# Patient Record
Sex: Male | Born: 1990 | Race: White | Hispanic: No | Marital: Single | Smoking: Former smoker
Health system: Southern US, Community
[De-identification: ages and names within clinical notes are randomized; demographics above are authoritative.]

## PROBLEM LIST (undated history)

## (undated) DIAGNOSIS — F32A Depression, unspecified: Secondary | ICD-10-CM

## (undated) DIAGNOSIS — F419 Anxiety disorder, unspecified: Secondary | ICD-10-CM

## (undated) DIAGNOSIS — F329 Major depressive disorder, single episode, unspecified: Secondary | ICD-10-CM

## (undated) HISTORY — PX: TESTICLE SURGERY: SHX794

## (undated) HISTORY — DX: Major depressive disorder, single episode, unspecified: F32.9

## (undated) HISTORY — DX: Anxiety disorder, unspecified: F41.9

## (undated) HISTORY — DX: Depression, unspecified: F32.A

---
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
## 2013-06-07 ENCOUNTER — Ambulatory Visit: Payer: Self-pay | Admitting: Family Medicine

## 2013-06-07 VITALS — BP 110/68 | HR 108 | Temp 98.0°F | Resp 16

## 2013-06-07 DIAGNOSIS — R1032 Left lower quadrant pain: Secondary | ICD-10-CM

## 2013-06-07 DIAGNOSIS — R509 Fever, unspecified: Secondary | ICD-10-CM

## 2013-06-07 DIAGNOSIS — R1033 Periumbilical pain: Secondary | ICD-10-CM

## 2013-06-07 DIAGNOSIS — J029 Acute pharyngitis, unspecified: Secondary | ICD-10-CM

## 2013-06-07 LAB — POCT CBC
GRANULOCYTE PERCENT: 92.1 % — AB (ref 37–80)
HCT, POC: 40.6 % — AB (ref 43.5–53.7)
Hemoglobin: 12.9 g/dL — AB (ref 14.1–18.1)
LYMPH, POC: 0.6 (ref 0.6–3.4)
MCH, POC: 29.1 pg (ref 27–31.2)
MCHC: 31.8 g/dL (ref 31.8–35.4)
MCV: 91.6 fL (ref 80–97)
MID (CBC): 0.3 (ref 0–0.9)
MPV: 8.1 fL (ref 0–99.8)
PLATELET COUNT, POC: 213 10*3/uL (ref 142–424)
POC GRANULOCYTE: 10.7 — AB (ref 2–6.9)
POC LYMPH %: 4.9 % — AB (ref 10–50)
POC MID %: 3 % (ref 0–12)
RBC: 4.43 M/uL — AB (ref 4.69–6.13)
RDW, POC: 15.6 %
WBC: 11.6 10*3/uL — AB (ref 4.6–10.2)

## 2013-06-07 LAB — POCT UA - MICROSCOPIC ONLY
Casts, Ur, LPF, POC: NEGATIVE
Crystals, Ur, HPF, POC: NEGATIVE
MUCUS UA: NEGATIVE
RBC, urine, microscopic: NEGATIVE
Yeast, UA: NEGATIVE

## 2013-06-07 LAB — POCT URINALYSIS DIPSTICK
Blood, UA: NEGATIVE
Glucose, UA: NEGATIVE
KETONES UA: 80
LEUKOCYTES UA: NEGATIVE
Nitrite, UA: NEGATIVE
PROTEIN UA: NEGATIVE
SPEC GRAV UA: 1.02
Urobilinogen, UA: 0.2
pH, UA: 5.5

## 2013-06-07 LAB — POCT RAPID STREP A (OFFICE): Rapid Strep A Screen: NEGATIVE

## 2013-06-07 NOTE — Progress Notes (Addendum)
Subjective:  This chart was scribed for Bryan StaggersJeffrey Greene, MD by Bryan Haynes, ED Scribe. The patient was seen in room 3. Patient's care was started at 4:59 PM.  Authored by Bryan Haynes, [facility] to [facility].    Patient ID: Bryan Haynes, male    DOB: [birth year], 23 y.o.   MRN: [id number]  HPI Pt presents with nausea onset 6 hours ago. He also reports associated chills, pain in nostrils, body aches and subjective fever. Pt has tried Excedrin (2) with mild relief. Pt reports abdominal pain and a mild sore throat onset 3 hours ago. Pt has also experienced one episode of vomiting that occurred in the office today. He denies diarrhea, hematemesis and difficulty urinating. Pt states that he had a rash on his face a few days ago that has subsided.   Patient also reports bleeding gums within the past month since being on Lamictal.  Patient was prescribed Lamictal 2 months ago but his dosage has an increased  to 100 mg dosage within the past week.  No sick contacts.  No recent travel to endemic areas.  No tick bites.    There are no active problems to display for this patient.  No past medical history on file. No past surgical history on file. 4:58 PM-Ordered 25 mg benadryl, 20 mg Pepcid and 60 mg prednisone  Prior to Admission medications   Medication Sig Start Date End Date Taking? Authorizing Provider  amphetamine-dextroamphetamine (ADDERALL XR) 20 MG 24 hr capsule Take 20 mg by mouth daily.   Yes Historical Provider, MD  aspirin-acetaminophen-caffeine (EXCEDRIN MIGRAINE) [phone] MG per tablet Take by mouth every 6 (six) hours as needed for headache.   Yes Historical Provider, MD  lamoTRIgine (LAMICTAL) 100 MG tablet Take 100 mg by mouth daily.   Yes Historical Provider, MD   History   Social History   Marital Status: Single    Spouse Name: N/A    Number of Children: N/A   Years of Education: N/A   Occupational History   Not on file.   Social History Main Topics    Smoking status: Former Smoker   Smokeless tobacco: Not on file   Alcohol Use: Not on file   Drug Use: Not on file   Sexual Activity: Not on file   Other Topics Concern   Not on file   Social History Narrative   No narrative on file      Review of Systems  Constitutional: Positive for fever and chills.  HENT: Positive for sore throat.   Gastrointestinal: Positive for nausea, vomiting and abdominal pain. Negative for diarrhea.  Genitourinary: Negative for difficulty urinating.  Skin: Positive for rash.       Objective:   Physical Exam  Vitals reviewed. Constitutional: He is oriented to person, place, and time. He appears well-developed and well-nourished.  HENT:  Head: Normocephalic and atraumatic.  Right Ear: Tympanic membrane, external ear and ear canal normal.  Left Ear: Tympanic membrane, external ear and ear canal normal.  Nose: No rhinorrhea.  Mouth/Throat: Oropharynx is clear and moist and mucous membranes are normal. No oropharyngeal exudate or posterior oropharyngeal erythema.  Eyes: Conjunctivae are normal. Pupils are equal, round, and reactive to light.  anisocoria with slightly enlarged right versus left pupil   Neck: Normal range of motion. Neck supple.  Cardiovascular: Normal rate, regular rhythm, normal heart sounds and intact distal pulses.   No murmur heard. Slightly tachycardic   Pulmonary/Chest: Effort normal and breath sounds normal. He  has no wheezes. He has no rhonchi. He has no rales.  Abdominal: Soft. There is no tenderness.  Left lower quadrant tender Tender to palpitation peri umbilical greater than left lower quadrant Negative McBurney's test Negative Murphy's sign Negative obturator sign Negative psoas sign  Lymphadenopathy:    He has no cervical adenopathy.  No LED   Neurological: He is alert and oriented to person, place, and time.  Skin: Skin is warm and dry. No rash noted.  Psychiatric: He has a normal mood and affect. His  behavior is normal.   Filed Vitals:   06/07/13 1601  BP: 110/68  Pulse: 108  Temp: 98 F (36.7 C)  TempSrc: Oral  Resp: 16  SpO2: 100%    Results for orders placed in visit on 06/07/13  POCT URINALYSIS DIPSTICK      Result Value Ref Range   Color, UA yellow     Clarity, UA clear     Glucose, UA neg     Bilirubin, UA small     Ketones, UA 80     Spec Grav, UA 1.020     Blood, UA neg     pH, UA 5.5     Protein, UA neg     Urobilinogen, UA 0.2     Nitrite, UA neg     Leukocytes, UA Negative    POCT UA - MICROSCOPIC ONLY      Result Value Ref Range   WBC, Ur, HPF, POC 0-1     RBC, urine, microscopic neg     Bacteria, U Microscopic trace     Mucus, UA neg     Epithelial cells, urine per micros 0-1     Crystals, Ur, HPF, POC neg     Casts, Ur, LPF, POC neg     Yeast, UA neg    POCT RAPID STREP A (OFFICE)      Result Value Ref Range   Rapid Strep A Screen Negative  Negative  POCT CBC      Result Value Ref Range   WBC 11.6 (*) 4.6 - 10.2 K/uL   Lymph, poc 0.6  0.6 - 3.4   POC LYMPH PERCENT 4.9 (*) 10 - 50 %L   MID (cbc) 0.3  0 - 0.9   POC MID % 3.0  0 - 12 %M   POC Granulocyte 10.7 (*) 2 - 6.9   Granulocyte percent 92.1 (*) 37 - 80 %G   RBC 4.43 (*) 4.69 - 6.13 M/uL   Hemoglobin 12.9 (*) 14.1 - 18.1 g/dL   HCT, POC 16.1 (*) 09.6 - 53.7 %   MCV 91.6  80 - 97 fL   MCH, POC 29.1  27 - 31.2 pg   MCHC 31.8  31.8 - 35.4 g/dL   RDW, POC 04.5     Platelet Count, POC 213  142 - 424 K/uL   MPV 8.1  0 - 99.8 fL      Assessment & Plan:   Bryan Haynes is a 23 y.o. male Fever, unspecified - Plan: POCT urinalysis dipstick, POCT rapid strep A, Culture, Group A Strep  Abdominal pain, left lower quadrant - Plan: POCT UA - Microscopic Only, POCT CBC  Sore throat - Plan: POCT urinalysis dipstick, POCT rapid strep A, Culture, Group A Strep  Periumbilical abdominal pain - Plan: POCT UA - Microscopic Only, POCT CBC  ? Viral illness with sore throat and abd pain. No exudate  or erythema on throat exam.  Unlikely appendicitis with  sore throat, but borderline CBC.  Options discussed, will hold on imaging at present, and recheck in 24 hours with repeat exam. Platelets ok, but if bleeding gums persist - may need to look at med cause. Facial rash resolved. Doubt medication cause/DRESS with lamictal at this point, but close follow up of symptoms above. RTC/ER precautions discussed overnight with patient and parent.   Meds ordered this encounter  Medications   lamoTRIgine (LAMICTAL) 100 MG tablet    Sig: Take 100 mg by mouth daily.   amphetamine-dextroamphetamine (ADDERALL XR) 20 MG 24 hr capsule    Sig: Take 20 mg by mouth daily.   aspirin-acetaminophen-caffeine (EXCEDRIN MIGRAINE) 250-250-65 MG per tablet    Sig: Take by mouth every 6 (six) hours as needed for headache.   Patient Instructions  Borderline infection fighting cells and hemoglobin (toal blood or anemia), but these can fluctuate some during illness. This may be a viral illness, but recheck tomorrow for repeat exam and blood counts.   To the emergency room overnight if worse. (Return to the clinic or go to the nearest emergency room if any of your symptoms worsen or new symptoms occur).  Tylenol if needed. Fluids and rest until recheck.  Abdominal Pain, Adult Many things can cause abdominal pain. Usually, abdominal pain is not caused by a disease and will improve without treatment. It can often be observed and treated at home. Your health care provider will do a physical exam and possibly order blood tests and X-rays to help determine the seriousness of your pain. However, in many cases, more time must pass before a clear cause of the pain can be found. Before that point, your health care provider may not know if you need more testing or further treatment. HOME CARE INSTRUCTIONS  Monitor your abdominal pain for any changes. The following actions may help to alleviate any discomfort you are experiencing:  Only  take over-the-counter or prescription medicines as directed by your health care provider.  Do not take laxatives unless directed to do so by your health care provider.  Try a clear liquid diet (broth, tea, or water) as directed by your health care provider. Slowly move to a bland diet as tolerated. SEEK MEDICAL CARE IF:  You have unexplained abdominal pain.  You have abdominal pain associated with nausea or diarrhea.  You have pain when you urinate or have a bowel movement.  You experience abdominal pain that wakes you in the night.  You have abdominal pain that is worsened or improved by eating food.  You have abdominal pain that is worsened with eating fatty foods. SEEK IMMEDIATE MEDICAL CARE IF:   Your pain does not go away within 2 hours.  You have a fever.  You keep throwing up (vomiting).  Your pain is felt only in portions of the abdomen, such as the right side or the left lower portion of the abdomen.  You pass bloody or black tarry stools. MAKE SURE YOU:  Understand these instructions.   Will watch your condition.   Will get help right away if you are not doing well or get worse.  Document Released: 12/02/2004 Document Revised: 12/13/2012 Document Reviewed: 11/01/2012 [facility] Patient Information 2014 [place], [place].      I personally performed the services described in this documentation, which was scribed in my presence. The recorded information has been reviewed and considered, and addended by me as needed.

## 2013-06-07 NOTE — Patient Instructions (Signed)
Borderline infection fighting cells and hemoglobin (toal blood or anemia), but these can fluctuate some during illness. This may be a viral illness, but recheck tomorrow for repeat exam and blood counts.   To the emergency room overnight if worse. (Return to the clinic or go to the nearest emergency room if any of your symptoms worsen or new symptoms occur).  Tylenol if needed. Fluids and rest until recheck.  Abdominal Pain, Adult Many things can cause abdominal pain. Usually, abdominal pain is not caused by a disease and will improve without treatment. It can often be observed and treated at home. Your health care provider will do a physical exam and possibly order blood tests and X-rays to help determine the seriousness of your pain. However, in many cases, more time must pass before a clear cause of the pain can be found. Before that point, your health care provider may not know if you need more testing or further treatment. HOME CARE INSTRUCTIONS  Monitor your abdominal pain for any changes. The following actions may help to alleviate any discomfort you are experiencing:  Only take over-the-counter or prescription medicines as directed by your health care provider.  Do not take laxatives unless directed to do so by your health care provider.  Try a clear liquid diet (broth, tea, or water) as directed by your health care provider. Slowly move to a bland diet as tolerated. SEEK MEDICAL CARE IF:  You have unexplained abdominal pain.  You have abdominal pain associated with nausea or diarrhea.  You have pain when you urinate or have a bowel movement.  You experience abdominal pain that wakes you in the night.  You have abdominal pain that is worsened or improved by eating food.  You have abdominal pain that is worsened with eating fatty foods. SEEK IMMEDIATE MEDICAL CARE IF:   Your pain does not go away within 2 hours.  You have a fever.  You keep throwing up (vomiting).  Your pain  is felt only in portions of the abdomen, such as the right side or the left lower portion of the abdomen.  You pass bloody or black tarry stools. MAKE SURE YOU:  Understand these instructions.   Will watch your condition.   Will get help right away if you are not doing well or get worse.  Document Released: 12/02/2004 Document Revised: 12/13/2012 Document Reviewed: 11/01/2012 [facility] Patient Information 2014 [place], [place].

## 2013-06-08 ENCOUNTER — Ambulatory Visit: Payer: Self-pay | Admitting: Family Medicine

## 2013-06-08 VITALS — BP 112/76 | HR 97 | Temp 98.6°F | Resp 16

## 2013-06-08 DIAGNOSIS — R1033 Periumbilical pain: Secondary | ICD-10-CM

## 2013-06-08 NOTE — Progress Notes (Signed)
Subjective:  This chart was scribed for Shade Flood, MD by Elveria Rising, Medial Scribe. This patient was seen in room 13 and the patient's care was started at 1:25 PM.    Patient ID: Bryan Haynes, male    DOB: 09/18/1990, 23 y.o.   MRN: [id number]  HPI Bryan Haynes is a 23 y.o. male PCP: No primary provider on file.  HPI Comments: Bryan Haynes is a 23 y.o. male who presents to the [facility] requesting follow up. Patient was seen yesterday for sore throat and abdominal pain. Borderline wbc count with wbc 11.6 with 92% granulocytes. Today patient reports that he feelinf much better and his symptoms have primarily resolved. Last night after his visit patient says he went home and tried eating. He went to sleep and was awakened around 9pm with stomach cramping and nausea for 20 minutes. The patient also had a low grade fever of 101F, that resolved after taking ibuprofen. Patient hasn't eaten today, but denies abdominal pain and true vomiting. Patient denies trouble urinating, presentation of new rash, or new symptoms.   There are no active problems to display for this patient.  History reviewed. No pertinent past medical history. History reviewed. No pertinent past surgical history. No Known Allergies Prior to Admission medications   Medication Sig Start Date End Date Taking? Authorizing Provider  amphetamine-dextroamphetamine (ADDERALL XR) 20 MG 24 hr capsule Take 20 mg by mouth daily.   Yes Historical Provider, MD  aspirin-acetaminophen-caffeine (EXCEDRIN MIGRAINE) [phone] MG per tablet Take by mouth every 6 (six) hours as needed for headache.   Yes Historical Provider, MD  lamoTRIgine (LAMICTAL) 100 MG tablet Take 100 mg by mouth daily.   Yes Historical Provider, MD   History   Social History  . Marital Status: Single    Spouse Name: N/A    Number of Children: N/A  . Years of Education: N/A   Occupational History  . Not on file.   Social History  Main Topics  . Smoking status: Former Games developer  . Smokeless tobacco: Not on file  . Alcohol Use: Not on file  . Drug Use: Not on file  . Sexual Activity: Not on file   Other Topics Concern  . Not on file   Social History Narrative  . No narrative on file       Review of Systems  Constitutional: Negative for fever.  HENT: Negative for sore throat.   Gastrointestinal: Negative for abdominal pain.       Objective:   Physical Exam  Nursing note and vitals reviewed. Constitutional: He is oriented to person, place, and time. He appears well-developed and well-nourished. No distress.  HENT:  Head: Normocephalic and atraumatic.  Right Ear: Tympanic membrane, external ear and ear canal normal.  Left Ear: Tympanic membrane, external ear and ear canal normal.  Nose: No rhinorrhea.  Mouth/Throat: Oropharynx is clear and moist and mucous membranes are normal. No oropharyngeal exudate or posterior oropharyngeal erythema.  Eyes: Conjunctivae and EOM are normal. Pupils are equal, round, and reactive to light.  Neck: Neck supple. No tracheal deviation present.  Cardiovascular: Normal rate, regular rhythm, normal heart sounds and intact distal pulses.   No murmur heard. Pulmonary/Chest: Effort normal and breath sounds normal. No respiratory distress. He has no wheezes. He has no rhonchi. He has no rales.  Abdominal: Soft. There is no tenderness. There is no rebound and no guarding.  Minimal tenderness LLQ. No tenderness over McBurney's point.  Minimal  periumbilical tenderness .   Musculoskeletal: Normal range of motion.  Lymphadenopathy:    He has no cervical adenopathy.  Neurological: He is alert and oriented to person, place, and time.  Skin: Skin is warm and dry. No rash noted.  Psychiatric: He has a normal mood and affect. His behavior is normal.     Filed Vitals:   06/08/13 1321  BP: 112/76  Pulse: 97  Temp: 98.6 F (37 C)  TempSrc: Oral  Resp: 16  SpO2: 100%          Assessment & Plan:  Bryan Haynes is a 23 y.o. male Periumbilical abdominal pain With sore throat, single episode vomiting - see ov yesterday. Much improved today, afebrile. Suspected viral illness, now improving. Deferred repeat testing, but rtc precautions discussed.   No orders of the defined types were placed in this encounter.   Patient Instructions  Will hold on blood count today as improving, but if fever returns or any worsening, recheck as discussed.  Return to the clinic or go to the nearest emergency room if any of your symptoms worsen or new symptoms occur. Abdominal Pain, Adult Many things can cause abdominal pain. Usually, abdominal pain is not caused by a disease and will improve without treatment. It can often be observed and treated at home. Your health care provider will do a physical exam and possibly order blood tests and X-rays to help determine the seriousness of your pain. However, in many cases, more time must pass before a clear cause of the pain can be found. Before that point, your health care provider may not know if you need more testing or further treatment. HOME CARE INSTRUCTIONS  Monitor your abdominal pain for any changes. The following actions may help to alleviate any discomfort you are experiencing:  Only take over-the-counter or prescription medicines as directed by your health care provider.  Do not take laxatives unless directed to do so by your health care provider.  Try a clear liquid diet (broth, tea, or water) as directed by your health care provider. Slowly move to a bland diet as tolerated. SEEK MEDICAL CARE IF:  You have unexplained abdominal pain.  You have abdominal pain associated with nausea or diarrhea.  You have pain when you urinate or have a bowel movement.  You experience abdominal pain that wakes you in the night.  You have abdominal pain that is worsened or improved by eating food.  You have abdominal pain that is worsened  with eating fatty foods. SEEK IMMEDIATE MEDICAL CARE IF:   Your pain does not go away within 2 hours.  You have a fever.  You keep throwing up (vomiting).  Your pain is felt only in portions of the abdomen, such as the right side or the left lower portion of the abdomen.  You pass bloody or black tarry stools. MAKE SURE YOU:  Understand these instructions.   Will watch your condition.   Will get help right away if you are not doing well or get worse.  Document Released: 12/02/2004 Document Revised: 12/13/2012 Document Reviewed: 11/01/2012 [facility] Patient Information 2014 [place], [place].

## 2013-06-08 NOTE — Patient Instructions (Signed)
Will hold on blood count today as improving, but if fever returns or any worsening, recheck as discussed.  Return to the clinic or go to the nearest emergency room if any of your symptoms worsen or new symptoms occur. Abdominal Pain, Adult Many things can cause abdominal pain. Usually, abdominal pain is not caused by a disease and will improve without treatment. It can often be observed and treated at home. Your health care provider will do a physical exam and possibly order blood tests and X-rays to help determine the seriousness of your pain. However, in many cases, more time must pass before a clear cause of the pain can be found. Before that point, your health care provider may not know if you need more testing or further treatment. HOME CARE INSTRUCTIONS  Monitor your abdominal pain for any changes. The following actions may help to alleviate any discomfort you are experiencing:  Only take over-the-counter or prescription medicines as directed by your health care provider.  Do not take laxatives unless directed to do so by your health care provider.  Try a clear liquid diet (broth, tea, or water) as directed by your health care provider. Slowly move to a bland diet as tolerated. SEEK MEDICAL CARE IF:  You have unexplained abdominal pain.  You have abdominal pain associated with nausea or diarrhea.  You have pain when you urinate or have a bowel movement.  You experience abdominal pain that wakes you in the night.  You have abdominal pain that is worsened or improved by eating food.  You have abdominal pain that is worsened with eating fatty foods. SEEK IMMEDIATE MEDICAL CARE IF:   Your pain does not go away within 2 hours.  You have a fever.  You keep throwing up (vomiting).  Your pain is felt only in portions of the abdomen, such as the right side or the left lower portion of the abdomen.  You pass bloody or black tarry stools. MAKE SURE YOU:  Understand these  instructions.   Will watch your condition.   Will get help right away if you are not doing well or get worse.  Document Released: 12/02/2004 Document Revised: 12/13/2012 Document Reviewed: 11/01/2012 [facility] Patient Information 2014 [place], [place].

## 2013-06-09 LAB — CULTURE, GROUP A STREP: ORGANISM ID, BACTERIA: NORMAL

## 2014-01-15 ENCOUNTER — Ambulatory Visit: Payer: Self-pay

## 2015-05-01 ENCOUNTER — Emergency Department (HOSPITAL_COMMUNITY)
Admission: EM | Admit: 2015-05-01 | Discharge: 2015-05-02 | Disposition: A | Discharge status: Home / Self Care | Payer: BLUE CROSS/BLUE SHIELD | Attending: Emergency Medicine | Admitting: Emergency Medicine

## 2015-05-01 ENCOUNTER — Encounter (HOSPITAL_COMMUNITY): Payer: Self-pay | Admitting: Emergency Medicine

## 2015-05-01 DIAGNOSIS — F1010 Alcohol abuse, uncomplicated: Secondary | ICD-10-CM | POA: Diagnosis present

## 2015-05-01 DIAGNOSIS — R45851 Suicidal ideations: Secondary | ICD-10-CM | POA: Insufficient documentation

## 2015-05-01 DIAGNOSIS — F16951 Hallucinogen use, unspecified with hallucinogen-induced psychotic disorder with hallucinations: Secondary | ICD-10-CM | POA: Diagnosis not present

## 2015-05-01 DIAGNOSIS — F1310 Sedative, hypnotic or anxiolytic abuse, uncomplicated: Secondary | ICD-10-CM | POA: Diagnosis not present

## 2015-05-01 DIAGNOSIS — Z79899 Other long term (current) drug therapy: Secondary | ICD-10-CM | POA: Insufficient documentation

## 2015-05-01 DIAGNOSIS — R443 Hallucinations, unspecified: Secondary | ICD-10-CM | POA: Diagnosis present

## 2015-05-01 DIAGNOSIS — F14951 Cocaine use, unspecified with cocaine-induced psychotic disorder with hallucinations: Secondary | ICD-10-CM | POA: Diagnosis present

## 2015-05-01 DIAGNOSIS — Z87891 Personal history of nicotine dependence: Secondary | ICD-10-CM | POA: Diagnosis not present

## 2015-05-01 LAB — CBC
HCT: 44.3 % (ref 39.0–52.0)
Hemoglobin: 15.5 g/dL (ref 13.0–17.0)
MCH: 31.2 pg (ref 26.0–34.0)
MCHC: 35 g/dL (ref 30.0–36.0)
MCV: 89.1 fL (ref 78.0–100.0)
PLATELETS: 257 10*3/uL (ref 150–400)
RBC: 4.97 MIL/uL (ref 4.22–5.81)
RDW: 13.3 % (ref 11.5–15.5)
WBC: 7.5 10*3/uL (ref 4.0–10.5)

## 2015-05-01 LAB — RAPID URINE DRUG SCREEN, HOSP PERFORMED
Amphetamines: NOT DETECTED
Barbiturates: NOT DETECTED
Benzodiazepines: POSITIVE — AB
Cocaine: NOT DETECTED
OPIATES: NOT DETECTED
Tetrahydrocannabinol: NOT DETECTED

## 2015-05-01 LAB — COMPREHENSIVE METABOLIC PANEL
ALT: 18 U/L (ref 17–63)
ANION GAP: 7 (ref 5–15)
AST: 25 U/L (ref 15–41)
Albumin: 4.6 g/dL (ref 3.5–5.0)
Alkaline Phosphatase: 69 U/L (ref 38–126)
BUN: 12 mg/dL (ref 6–20)
CHLORIDE: 106 mmol/L (ref 101–111)
CO2: 25 mmol/L (ref 22–32)
CREATININE: 0.94 mg/dL (ref 0.61–1.24)
Calcium: 9.1 mg/dL (ref 8.9–10.3)
Glucose, Bld: 96 mg/dL (ref 65–99)
Potassium: 3.7 mmol/L (ref 3.5–5.1)
Sodium: 138 mmol/L (ref 135–145)
Total Bilirubin: 1 mg/dL (ref 0.3–1.2)
Total Protein: 7.9 g/dL (ref 6.5–8.1)

## 2015-05-01 LAB — SALICYLATE LEVEL

## 2015-05-01 LAB — ACETAMINOPHEN LEVEL: Acetaminophen (Tylenol), Serum: <10 ug/mL — ABNORMAL LOW (ref 10–30)

## 2015-05-01 LAB — ETHANOL

## 2015-05-01 MED ORDER — ACETAMINOPHEN 325 MG PO TABS
650.0000 mg | ORAL_TABLET | ORAL | Status: DC | PRN
Start: 2015-05-01 — End: 2015-05-02

## 2015-05-01 MED ORDER — IBUPROFEN 200 MG PO TABS: 600.0000 mg | ORAL_TABLET | Freq: Three times a day (TID) | ORAL | Status: DC | PRN

## 2015-05-01 MED ORDER — ZOLPIDEM TARTRATE 5 MG PO TABS: 5.0000 mg | ORAL_TABLET | Freq: Every evening | ORAL | Status: DC | PRN

## 2015-05-01 MED ORDER — ALUM & MAG HYDROXIDE-SIMETH 200-200-20 MG/5ML PO SUSP: 30.0000 mL | ORAL | Status: DC | PRN

## 2015-05-01 MED ORDER — ONDANSETRON HCL 4 MG PO TABS: 4.0000 mg | ORAL_TABLET | Freq: Three times a day (TID) | ORAL | Status: DC | PRN

## 2015-05-01 NOTE — ED Notes (Signed)
Bed: [facility]39 Expected date:  Expected time:  Means of arrival:  Comments: Triage 4 

## 2015-05-01 NOTE — BH Assessment (Signed)
Assessment completed. Consulted Hulan Fess, NP who recommended inpatient treatment. TTS to seek placement Informed Charlestine Night, PA-C of the recommendation.
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
## 2015-05-01 NOTE — ED Notes (Signed)
Patient with Hx of depression, bipolar disorder c/o SI and visual and sensory hallucinations onset Tuesday after imbibing alcohol and using what he believed to be cocaine. States hallucinations include visions of people when he closes his eyes, and today he hallucinated a moose attack him and felt the pain of the attack even though it didn't happen. Denies any substance use since Tuesday. Took what he believed to be xanax or a benzo 2 weeks ago. Uses home adderall sporadically. Denies HI/AVH.

## 2015-05-01 NOTE — ED Provider Notes (Signed)
CSN: [id number]     Arrival date & time 05/01/15  1628 History   First MD Initiated Contact with Patient 05/01/15 1720     Chief Complaint  Patient presents with  . Hallucinations     (Consider location/radiation/quality/duration/timing/severity/associated sxs/prior Treatment) HPI Patient presents to the emergency department with hallucinations that started several days ago.  Patient states that he has been havingsexual hallucinations since Tuesday.  The patient states that today he started having worsening hallucinations to where he felt like himwas attacking him and he felt the pain.  Attack even that did not happen.  Patient states that he abuses Xanax and alcohol.  Patient denies nausea, vomiting, weakness to his headache, blurred vision, chest pain, shortness of breath, back pain, neck pain, or syncope History reviewed. No pertinent past medical history. History reviewed. No pertinent past surgical history. History reviewed. No pertinent family history. Social History  Substance Use Topics  . Smoking status: Former Games developer  . Smokeless tobacco: None  . Alcohol Use: Yes     Comment: ocassionally    Review of Systems  All other systems negative except as documented in the HPI. All pertinent positives and negatives as reviewed in the HPI.  Allergies  Review of patient's allergies indicates no known allergies.  Home Medications   Prior to Admission medications   Medication Sig Start Date End Date Taking? Authorizing Provider  amphetamine-dextroamphetamine (ADDERALL XR) 20 MG 24 hr capsule Take 20 mg by mouth daily.   Yes Historical Provider, MD  aspirin-acetaminophen-caffeine (EXCEDRIN MIGRAINE) [phone] MG per tablet Take by mouth every 6 (six) hours as needed for headache.   Yes Historical Provider, MD  lamoTRIgine (LAMICTAL) 100 MG tablet Take 100 mg by mouth daily.   Yes Historical Provider, MD  QUEtiapine (SEROQUEL) 25 MG tablet Take 25 mg by mouth at bedtime as needed  (sleep).  02/24/15  Yes Historical Provider, MD  VIIBRYD 40 MG TABS Take 40 mg by mouth daily.  04/29/15  Yes Historical Provider, MD   BP 140/99 mmHg  Pulse 87  Temp(Src) 98.4 F (36.9 C) (Oral)  Resp 16  SpO2 98% Physical Exam  Constitutional: He is oriented to person, place, and time. He appears well-developed and well-nourished. No distress.  HENT:  Head: Normocephalic and atraumatic.  Mouth/Throat: Oropharynx is clear and moist.  Eyes: Pupils are equal, round, and reactive to light.  Neck: Normal range of motion. Neck supple.  Cardiovascular: Normal rate, regular rhythm and normal heart sounds.  Exam reveals no gallop and no friction rub.   No murmur heard. Pulmonary/Chest: Effort normal and breath sounds normal. No respiratory distress. He has no wheezes.  Abdominal: Soft. Bowel sounds are normal. He exhibits no distension. There is no tenderness.  Neurological: He is alert and oriented to person, place, and time. He exhibits normal muscle tone. Coordination normal.  Skin: Skin is warm and dry. No rash noted. No erythema.  Psychiatric: He has a normal mood and affect. His mood appears not anxious. His affect is not blunt and not labile. His speech is rapid and/or pressured. He is not agitated and not slowed. Thought content is not delusional. He does not exhibit a depressed mood. He expresses suicidal ideation. He expresses no homicidal ideation. He expresses no suicidal plans and no homicidal plans.  Nursing note and vitals reviewed.   ED Course  Procedures (including critical care time) Labs Review Labs Reviewed  ACETAMINOPHEN LEVEL - Abnormal; Notable for the following:    Acetaminophen (Tylenol), Serum <  10 (*)    All other components within normal limits  URINE RAPID DRUG SCREEN, HOSP PERFORMED - Abnormal; Notable for the following:    Benzodiazepines POSITIVE (*)    All other components within normal limits  COMPREHENSIVE METABOLIC PANEL  ETHANOL  SALICYLATE LEVEL   CBC    Imaging Review No results found. I have personally reviewed and evaluated these images and lab results as part of my medical decision-making.  Patient will need TTS assessment and placement for his hallucinations    Charlestine Night, PA-C 05/02/15 0038  Derwood Kaplan, MD 05/02/15 0040

## 2015-05-02 DIAGNOSIS — F1310 Sedative, hypnotic or anxiolytic abuse, uncomplicated: Secondary | ICD-10-CM | POA: Diagnosis present

## 2015-05-02 DIAGNOSIS — F14951 Cocaine use, unspecified with cocaine-induced psychotic disorder with hallucinations: Secondary | ICD-10-CM | POA: Diagnosis present

## 2015-05-02 DIAGNOSIS — F16951 Hallucinogen use, unspecified with hallucinogen-induced psychotic disorder with hallucinations: Secondary | ICD-10-CM

## 2015-05-02 DIAGNOSIS — F1010 Alcohol abuse, uncomplicated: Secondary | ICD-10-CM | POA: Diagnosis present

## 2015-05-02 NOTE — ED Notes (Signed)
Patient did not sleep at all last night. Respirations equal and unlabored, skin warm and dry. NAD noted. Encouragement and support provided and safety maintain. Q 15 min safety checks remain in place.

## 2015-05-02 NOTE — [facility] Suicide Risk Assessment (Signed)
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 Cocaine-induced psychotic disorder with hallucinations [facility]) [W29.562] 05/02/2015    Priority: High  . Alcohol use disorder, mild, abuse [F10.10] 05/02/2015    Priority: High  . 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   HENT: Negative.   Eyes: Negative.   Respiratory: Negative.   Cardiovascular: Negative.   Gastrointestinal: Negative.   Genitourinary: Negative.   Musculoskeletal: Negative.   Skin: Negative.   Neurological: Negative.   Endo/Heme/Allergies: Negative.   Psychiatric/Behavioral: Positive for hallucinations and substance abuse. The patient has insomnia.     Blood pressure 123/77, pulse 65, temperature 98.1 F (36.7 C), temperature source Oral, resp. rate 18, SpO2 99 %.There is no height or weight on file to calculate BMI.  General Appearance: Casual  Eye Contact::  Good  Speech:  Normal Rate  Volume:  Normal  Mood:  Euthymic  Affect:  Congruent  Thought Process:  Goal Directed, Linear and Logical  Orientation:  Full (Time, Place, and Person)  Thought Content:  WDL  Suicidal Thoughts:  No  Homicidal Thoughts:  No  Memory:  Immediate;   Good Recent;   Good Remote;   Good  Judgement:  Fair  Insight:  Good  Psychomotor Activity:  Normal  Concentration:  Good  Recall:  Good  Fund of Knowledge:Good   Language: Good  Akathisia:  No  Handed:  Right  AIMS (if indicated):     Assets:  Architect Housing Physical Health Resilience Social Support  ADL's:  Intact  Cognition: WNL  Sleep:       Mental Status Per Nursing Assessment::   On Admission:     Demographic Factors:  Male, Adolescent or young adult and Caucasian  Loss Factors: NA  Historical Factors: Impulsivity  Risk Reduction Factors:   Sense of responsibility to family, Employed, Living with another person, especially a relative, Positive social support and Positive therapeutic relationship  Continued Clinical Symptoms:  Alcohol/Substance Abuse/Dependencies  Cognitive Features That Contribute To Risk:  None    Suicide Risk:  Minimal: No identifiable suicidal ideation.  Patients presenting with no risk factors but with morbid ruminations; may be classified as minimal risk based on the severity of the depressive symptoms  Plan Of Care/Follow-up recommendations:  Activity: As Tolerated Diet: Heart Healthy  Nanine Means, NP 05/02/2015, 11:15 AM

## 2015-05-02 NOTE — BH Assessment (Signed)
[facility] Assessment Progress Note  Per Carolanne Grumbling, MD, this pt does not require psychiatric hospitalization at this time.  Pt is to be discharged from [facility] with recommendation to follow up with his current outpatient providers at [facility].  This recommendation has been included in pt's discharge instructions.  Pt's nurse, Carlisle Beers, has been notified.  Doylene Canning, MA Triage Specialist [phone]

## 2015-05-02 NOTE — ED Notes (Signed)
Patient denies SI and HI at this time but admits to having suicidal thoughts earlier. Patient admits to AVH.Bryan Haynes Patient states that he hears voices saying negative things about him. Patient states he sees things in multiplicative. Plan of care discussed. Patient voices no complaints or concerns at this time. Encouragement and support provided and safety maintain. Q 15 min safety checks remain in place.

## 2015-05-02 NOTE — Consult Note (Signed)
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 Cocaine-induced psychotic disorder with hallucinations [facility]) [P71.062] 05/02/2015    Priority: High  . Alcohol use disorder, mild, abuse [F10.10] 05/02/2015    Priority: High  . 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 male patient admitted with polysubstance use, hallucinogen induced hallucinations, and insomnia.  HPI:  Bryan Haynes is a 25 year-old Caucasian male admitted with hallucinogen induced hallucinations and insomnia. Patient currently denies suicidal ideations, homicidal ideations, and auditory and visual hallucinations. Patient was previously positive for visual hallucinations and reported that a "moose was attacking me" after using "something that I thought was cocaine this past Tuesday". Patient reports that he has had insomnia after this, during which he also experienced hallucinations including "naked women dancing." Patient reports a history of polysubstance abuse and reports using "everything" including "alcohol, cocaine, benzodiazepines, and hallucinogens." Patient reports he has been using alcohol since the age of 55 and has been using cocaine, benzodiazepines, and hallucinogens since the age of 64. Patient currently reports his sleep has improved (reports 3 hours last night) and that the visual hallucinations have decreased ("I haven't seen any today"). 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 Client denies wanting detox from drugs, reporting that he is "curious about trying everything I can and I don't think it's a problem."   Past Psychiatric History: None reported  Risk to Self: Suicidal Ideation: Yes-Currently Present Suicidal Intent: No Is patient at risk for suicide?: No Suicidal Plan?: No Access to Means: No What has been your use of drugs/alcohol within the last 12 months?: Alcohol and drug use reported.  How many times?: 0 Other Self Harm Risks: Pt denies  Triggers for Past Attempts: None known Intentional Self Injurious Behavior: None Risk to Others: Homicidal Ideation: No Thoughts of Harm to Others: No Current Homicidal Intent: No Current Homicidal Plan: No Access to Homicidal Means: No Identified Victim: N/A History of harm to others?: No Assessment of Violence: None Noted Violent Behavior Description: No violent behaviors observed. Pt is calm and cooperative at this time.  Does patient have access to weapons?: No Criminal Charges Pending?: No Does patient have a court date: No Prior Inpatient Therapy: Prior Inpatient Therapy: No Prior Therapy Dates: N/A Prior Therapy Facilty/Provider(s): N/A Reason for Treatment: N/A Prior Outpatient Therapy: Prior Outpatient Therapy: Yes Prior Therapy Dates: 2008-current  Prior Therapy Facilty/Provider(s): [facility], Dr. Garnette Scheuermann, Yvone Neu, [facility]  Reason for Treatment: Medication management, OPT (Bipolar, depression, anxiety ) Does patient have an ACCT team?: No Does patient have Intensive In-House Services?  : No Does patient have [facility] services? : No Does patient have P4CC services?: No  Past Medical History: History reviewed. No pertinent past medical history. History reviewed. No pertinent past surgical history. Family History: History reviewed. No pertinent family history. Family Psychiatric  History:  none reported Social History:  History  Alcohol Use  . Yes    Comment: ocassionally  History  Drug Use Not on file    Social History   Social History  . Marital Status: Single    Spouse Name: N/A  . Number of Children: N/A  . Years of Education: N/A   Social History Main Topics  . Smoking status: Former Smoker  . Smokeless tobacco: None  . Alcohol Use: Yes     Comment: ocassionally  . Drug Use: None  . Sexual Activity: Not Asked   Other Topics Concern  . None   Social History Narrative   Additional Social History:    Allergies:  No Known Allergies  Labs:  Results for orders placed or performed during the hospital encounter of 05/01/15 (from the past 48 hour(s))  Comprehensive metabolic panel     Status: None   Collection Time: 05/01/15  5:02 PM  Result Value Ref Range   Sodium 138 135 - 145 mmol/L   Potassium 3.7 3.5 - 5.1 mmol/L   Chloride 106 101 - 111 mmol/L   CO2 25 22 - 32 mmol/L   Glucose, Bld 96 65 - 99 mg/dL   BUN 12 6 - 20 mg/dL   Creatinine, Ser 0.94 0.61 - 1.24 mg/dL   Calcium 9.1 8.9 - 10.3 mg/dL   Total Protein 7.9 6.5 - 8.1 g/dL   Albumin 4.6 3.5 - 5.0 g/dL   AST 25 15 - 41 U/L   ALT 18 17 - 63 U/L   Alkaline Phosphatase 69 38 - 126 U/L   Total Bilirubin 1.0 0.3 - 1.2 mg/dL   GFR calc non Af Amer >60 >60 mL/min   GFR calc Af Amer >60 >60 mL/min    Comment: (NOTE) The eGFR has been calculated using the CKD EPI equation. This calculation has not been validated in all clinical situations. eGFR's persistently <60 mL/min signify possible Chronic Kidney Disease.    Anion gap 7 5 - 15  CBC     Status: None   Collection Time: 05/01/15  5:02 PM  Result Value Ref Range   WBC 7.5 4.0 - 10.5 K/uL   RBC 4.97 4.22 - 5.81 MIL/uL   Hemoglobin 15.5 13.0 - 17.0 g/dL   HCT 44.3 39.0 - 52.0 %   MCV 89.1 78.0 - 100.0 fL   MCH 31.2 26.0 - 34.0 pg   MCHC 35.0 30.0 - 36.0 g/dL   RDW 13.3 11.5 - 15.5 %   Platelets 257 150 - 400 K/uL  Ethanol (ETOH)      Status: None   Collection Time: 05/01/15  5:03 PM  Result Value Ref Range   Alcohol, Ethyl (B) <5 <5 mg/dL    Comment:        LOWEST DETECTABLE LIMIT FOR SERUM ALCOHOL IS 5 mg/dL FOR MEDICAL PURPOSES ONLY   Salicylate level     Status: None   Collection Time: 05/01/15  5:03 PM  Result Value Ref Range   Salicylate Lvl <4.0 2.8 - 30.0 mg/dL  Acetaminophen level     Status: Abnormal   Collection Time: 05/01/15  5:03 PM  Result Value Ref Range   Acetaminophen (Tylenol), Serum <10 (L) 10 - 30 ug/mL    Comment:        THERAPEUTIC CONCENTRATIONS VARY SIGNIFICANTLY. A RANGE OF 10-30 ug/mL MAY BE AN EFFECTIVE CONCENTRATION FOR MANY PATIENTS. HOWEVER, SOME ARE BEST TREATED AT CONCENTRATIONS OUTSIDE THIS RANGE. ACETAMINOPHEN CONCENTRATIONS >150 ug/mL AT 4 HOURS AFTER INGESTION AND >50 ug/mL AT 12 HOURS AFTER INGESTION ARE OFTEN ASSOCIATED WITH TOXIC REACTIONS.     Urine rapid drug screen (hosp performed) (Not at [facility])     Status: Abnormal   Collection Time: 05/01/15  7:20 PM  Result Value Ref Range   Opiates NONE DETECTED NONE DETECTED   Cocaine NONE DETECTED NONE DETECTED   Benzodiazepines POSITIVE (A) NONE DETECTED   Amphetamines NONE DETECTED NONE DETECTED   Tetrahydrocannabinol NONE DETECTED NONE DETECTED   Barbiturates NONE DETECTED NONE DETECTED    Comment:        DRUG SCREEN FOR MEDICAL PURPOSES ONLY.  IF CONFIRMATION IS NEEDED FOR ANY PURPOSE, NOTIFY LAB WITHIN 5 DAYS.        LOWEST DETECTABLE LIMITS FOR URINE DRUG SCREEN Drug Class       Cutoff (ng/mL) Amphetamine      1000 Barbiturate      200 Benzodiazepine   606 Tricyclics       301 Opiates          300 Cocaine          300 THC              50     Current Facility-Administered Medications  Medication Dose Route Frequency Provider Last Rate Last Dose  . acetaminophen (TYLENOL) tablet 650 mg  650 mg Oral Q4H PRN Dalia Heading, PA-C      . alum & mag hydroxide-simeth (MAALOX/MYLANTA) 200-200-20  MG/5ML suspension 30 mL  30 mL Oral PRN Dalia Heading, PA-C      . ibuprofen (ADVIL,MOTRIN) tablet 600 mg  600 mg Oral Q8H PRN Dalia Heading, PA-C      . ondansetron (ZOFRAN) tablet 4 mg  4 mg Oral Q8H PRN Dalia Heading, PA-C      . zolpidem (AMBIEN) tablet 5 mg  5 mg Oral QHS PRN Dalia Heading, PA-C       Current Outpatient Prescriptions  Medication Sig Dispense Refill  . amphetamine-dextroamphetamine (ADDERALL XR) 20 MG 24 hr capsule Take 20 mg by mouth daily.    Marland Kitchen aspirin-acetaminophen-caffeine (EXCEDRIN MIGRAINE) 250-250-65 MG per tablet Take by mouth every 6 (six) hours as needed for headache.    . lamoTRIgine (LAMICTAL) 100 MG tablet Take 100 mg by mouth daily.    . QUEtiapine (SEROQUEL) 25 MG tablet Take 25 mg by mouth at bedtime as needed (sleep).   0  . VIIBRYD 40 MG TABS Take 40 mg by mouth daily.   1    Musculoskeletal: Strength & Muscle Tone: within normal limits Gait & Station: normal Patient leans: N/A  Psychiatric Specialty Exam: Review of Systems  Constitutional: Negative.   HENT: Negative.   Eyes: Negative.   Respiratory: Negative.   Cardiovascular: Negative.   Gastrointestinal: Negative.   Genitourinary: Negative.   Musculoskeletal: Negative.   Skin: Negative.   Neurological: Negative.   Endo/Heme/Allergies: Negative.   Psychiatric/Behavioral: Positive for hallucinations and substance abuse. The patient has insomnia.     Blood pressure 123/77, pulse 65, temperature 98.1 F (36.7 C), temperature source Oral, resp. rate 18, SpO2 99 %.There is no height or weight on file to calculate BMI.  General Appearance: Casual  Eye Contact::  Good  Speech:  Normal Rate  Volume:  Normal  Mood:  Euthymic  Affect:  Congruent  Thought Process:  Goal Directed, Linear and Logical  Orientation:  Full (Time, Place, and Person)  Thought Content:  WDL  Suicidal Thoughts:  No  Homicidal Thoughts:  No  Memory:  Immediate;   Good Recent;   Good Remote;    Good  Judgement:  Fair  Insight:  Good  Psychomotor Activity:  Normal  Concentration:  Good  Recall:  Good  Fund of Knowledge:Good  Language: Good  Akathisia:  No  Handed:  Right  AIMS (if indicated):     Assets:  Communication Skills Financial Resources/Insurance Housing Physical Health Resilience Social Support  ADL's:  Intact  Cognition: WNL  Sleep:      Treatment Plan Summary: Daily contact with patient to assess and evaluate symptoms and progress in treatment and Plan : Hallucinogen Use with Hallucinogen-induced psychotic disorder with hallucinations -Crisis Stabilization -Individual & Substance Abuse Counseling -Discharge to home with resources  Disposition: No evidence of imminent risk to self or others at present.   Patient does not meet criteria for psychiatric inpatient admission. Patient to be discharged to home with resources  LORD, JAMISON, NP 05/02/2015 10:59 AM  

## 2015-05-02 NOTE — Discharge Instructions (Signed)
For your ongoing behavioral health needs, you are advised to follow up with [facility], your current outpatient treatment provider:       [facility]      [address] [place], [place] [address]      [phone]

## 2015-07-17 ENCOUNTER — Ambulatory Visit: Payer: Self-pay

## 2016-08-25 ENCOUNTER — Ambulatory Visit (INDEPENDENT_AMBULATORY_CARE_PROVIDER_SITE_OTHER): Payer: Self-pay | Admitting: Family Medicine

## 2016-08-25 VITALS — BP 126/83 | HR 86 | Temp 98.6°F | Resp 16 | Ht 71.0 in | Wt 156.5 lb

## 2016-08-25 DIAGNOSIS — M533 Sacrococcygeal disorders, not elsewhere classified: Secondary | ICD-10-CM

## 2016-08-25 NOTE — Progress Notes (Signed)
   Bryan Haynes is a 26 y.o. male who presents to [facility] today for  Chief Complaint  Patient presents with  . Pain    Tailbone pain x 3 months - pts sits a lot a new job    1.  Tailbone pain: Patient notes that for the last couple months he has had pain in his tail bone only when sitting. 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 He has tried intermittent Ibuprofen without relief. Has also tried standing sometimes at his desk. Has been compensating but leaning to one side at his desk while sitting. No pain with ambulation. No weakness,numbness, tingling. No rash or skin lesions.   ROS as above.  Pertinently, no  Fever, Chills, N/V/D.   PMH reviewed. Patient is a nonsmoker.    Medications reviewed. Current Outpatient Prescriptions  Medication Sig Dispense Refill  . amphetamine-dextroamphetamine (ADDERALL XR) 20 MG 24 hr capsule Take 20 mg by mouth daily.    Marland Kitchen. aspirin-acetaminophen-caffeine (EXCEDRIN MIGRAINE) 250-250-65 MG per tablet Take by mouth every 6 (six) hours as needed for headache.    . lamoTRIgine (LAMICTAL) 100 MG tablet Take 100 mg by mouth daily.    . QUEtiapine (SEROQUEL) 25 MG tablet Take 25 mg by mouth at bedtime as needed (sleep).   0  . VIIBRYD 40 MG TABS Take 40 mg by mouth daily.   1   No current facility-administered medications for this visit.      Physical Exam:  BP 126/83   Pulse 86   Temp 98.6 F (37 C) (Oral)   Resp 16   Ht 5\' 11"  (1.803 m)   Wt 156 lb 8 oz (71 kg)   SpO2 97%   BMI 21.83 kg/m  Gen:  Alert, cooperative patient who appears stated age in no acute distress.  Vital signs reviewed. Back Exam:  Inspection: Unremarkable  Palpable tenderness: Tender to palpation at the distal end of the coccyx, otherwise unremarkable Range of Motion:  Flexion 45 deg; Extension 45 deg; Side Bending to 45 deg bilaterally; Rotation to 45 deg bilaterally  Leg strength: Quad: 5/5 Hamstring: 5/5  Hip flexor: 5/5 Hip abductors: 5/5  Strength at foot: Plantar-flexion: 5/5 Dorsi-flexion: 5/5 Eversion: 5/5 Inversion: 5/5  Sensory change: Gross sensation intact to all lumbar and sacral dermatomes.  Reflexes: 2+ at both patellar tendons, 2+ at achilles tendons, Babinski's downgoing.  Gait unremarkable. SLR laying: Negative  XSLR laying: Negative   Assessment and Plan:  1.  Coccyx pain: Secondary to sitting on an office chair for prolonged periods of time. No trauma or suspicion of fracture.  - Suggested standing while working at desk intermittently as well as buying a supportive cushion for office chair  - Ibuprofen 600 mg BID x 7 days and then PRN - Offered xray for reassurance but patient declined at this time - Continue physical activity and yoga - Return to office if symptoms fail to improve after 1 week or worsen   Anders Simmondshristina Nickey Kloepfer, MD [facility], PGY-2

## 2016-08-25 NOTE — Patient Instructions (Addendum)
Thank you for coming in today, it was so nice to see you! Today we talked about:    Tail bone pain: This is likely some bruising in the area from sitting in an office chair. Continue standing at your desk when possible. You can buy a chair cushion to help as well. Take 600 mg of Ibuprofen twice a day for the next week and then after that just take ibuprofen sparingly as needed. Continue yoga :) If you continue to have pain or it gets worse, please come back and see us.   Take care!   Sincerely,  Anders Simmondshristina Gambino, MD     IF you received an x-ray today, you will receive an invoice from [facility]. Please contact [facility] at [phone] with questions or concerns regarding your invoice.   IF you received labwork today, you will receive an invoice from [facility]. Please contact [facility] at [phone] with questions or concerns regarding your invoice.   Our billing staff will not be able to assist you with questions regarding bills from these companies.  You will be contacted with the lab results as soon as they are available. The fastest way to get your results is to activate your My Chart account. Instructions are located on the last page of this paperwork. If you have not heard from us regarding the results in 2 weeks, please contact this office.

## 2016-09-17 ENCOUNTER — Encounter: Payer: Self-pay | Admitting: Family Medicine

## 2017-01-25 ENCOUNTER — Ambulatory Visit (INDEPENDENT_AMBULATORY_CARE_PROVIDER_SITE_OTHER): Payer: Self-pay

## 2017-01-25 ENCOUNTER — Other Ambulatory Visit: Payer: Self-pay

## 2017-01-25 ENCOUNTER — Ambulatory Visit (INDEPENDENT_AMBULATORY_CARE_PROVIDER_SITE_OTHER): Payer: Self-pay | Admitting: Family Medicine

## 2017-01-25 ENCOUNTER — Encounter: Payer: Self-pay | Admitting: Family Medicine

## 2017-01-25 VITALS — BP 110/62 | HR 97 | Temp 97.7°F | Resp 18 | Ht 73.78 in | Wt 166.8 lb

## 2017-01-25 DIAGNOSIS — R202 Paresthesia of skin: Secondary | ICD-10-CM

## 2017-01-25 DIAGNOSIS — R200 Anesthesia of skin: Secondary | ICD-10-CM

## 2017-01-25 DIAGNOSIS — M533 Sacrococcygeal disorders, not elsewhere classified: Secondary | ICD-10-CM

## 2017-01-25 DIAGNOSIS — K921 Melena: Secondary | ICD-10-CM

## 2017-01-25 NOTE — Progress Notes (Signed)
11/20/20189:15 AM  Bryan Haynes 08-11-1990, 26 y.o. male [id number]  Chief Complaint  Patient presents with  . tail bone pain    x 9mths  . leg numbness    x 2mo in both legs    HPI:   Patient is a 26 y.o. male who presents today for worsening tail bone pain for past 9 months. 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 Thought to be related to prolonged sitting, however, it has not improved despite use of cushions and a standing desk, NSAIDs and stretching. He states pain is constant and now having intermittent tightness, numbness of his thighs, with increased frequency this past month. He denies any past traumas. Has never had any imaging. He denies any bladder issues, but reports frequent blood in stool, has always presumed it is hemorrhoids, denies any problems with constipation. Denies fhx of IBD.  [facility] 01/25/2017 08/25/2016  Decreased Interest 0 0  Down, Depressed, Hopeless 0 0  PHQ - 2 Score 0 0    No Known Allergies  Prior to Admission medications   Medication Sig Start Date End Date Taking? Authorizing Provider    Past Medical History:  Diagnosis Date  . Anxiety   . Depression     Past Surgical History:  Procedure Laterality Date  . TESTICLE SURGERY     26yo    Social History   Tobacco Use  . Smoking status: Former Games developermoker  . Smokeless tobacco: Never Used  Substance Use Topics  . Alcohol use: No    Frequency: Never    Comment: not in the past 18mos    Family History  Problem Relation Age of Onset  . Diabetes Mother   . Hyperlipidemia Mother   . Alcohol abuse Father     Review of Systems  Constitutional: Negative for chills, fever, malaise/fatigue and weight loss.  Respiratory: Negative for cough and shortness of breath.   Cardiovascular: Negative for chest pain and palpitations.  Gastrointestinal: Positive for blood in stool. Negative for abdominal pain, constipation,  diarrhea, melena, nausea and vomiting.  Genitourinary: Negative for dysuria and hematuria.  Musculoskeletal: Positive for back pain. Negative for joint pain and myalgias.  Neurological: Positive for tingling. Negative for focal weakness.  Endo/Heme/Allergies:       Negative for swollen LN     OBJECTIVE:  Blood pressure 110/62, pulse 97, temperature 97.7 F (36.5 C), temperature source Oral, resp. rate 18, height 6' 1.78" (1.874 m), weight 166 lb 12.8 oz (75.7 kg), SpO2 97 %.  Physical Exam  Constitutional: He is oriented to person, place, and time and well-developed, well-nourished, and in no distress.  HENT:  Head: Normocephalic and atraumatic.  Right Ear: Hearing, tympanic membrane, external ear and ear canal normal.  Left Ear: Hearing, tympanic membrane, external ear and ear canal normal.  Mouth/Throat: Oropharynx is clear and moist. No oropharyngeal exudate.  Eyes: Conjunctivae and EOM are normal. Pupils are equal, round, and reactive to light.  Neck: Neck supple. No thyromegaly present.  Cardiovascular: Normal rate, regular rhythm, normal heart sounds and intact distal pulses. Exam reveals no gallop and no friction rub.  No murmur heard. Pulmonary/Chest: Effort normal and breath sounds normal. He has no wheezes. He has no rales.  Abdominal: Soft. Bowel sounds are normal. He exhibits no distension and no mass. There is no tenderness.  Genitourinary: Rectal exam shows no external hemorrhoid, no fissure and no mass.  Musculoskeletal: Normal range of motion. He exhibits no edema.       Back:  Hips FROM, negative Fabers  Lymphadenopathy:    He has no cervical adenopathy.  Neurological: He is alert and oriented to person, place, and time. He has normal strength and normal reflexes. No cranial nerve deficit. He has a normal Straight Leg Raise Test. Gait normal.  Skin: Skin is warm and dry.  Psychiatric: Mood and affect normal.    No results found for this or any previous visit  (from the past 24 hour(s)).  Dg Lumbar Spine Complete  Result Date: 01/25/2017 CLINICAL DATA:  Chronic lower back pain with bilateral lower extremity numbness. EXAM: LUMBAR SPINE - COMPLETE 4+ VIEW COMPARISON:  None. FINDINGS: There is no evidence of lumbar spine fracture. Alignment is normal. Intervertebral disc spaces are maintained. IMPRESSION: Normal lumbar spine. Electronically Signed   By: Lupita RaiderJames  Green Jr, M.D.   On: 01/25/2017 10:11   Dg Sacrum/coccyx  Result Date: 01/25/2017 CLINICAL DATA:  Tail bone pain without known injury. EXAM: SACRUM AND COCCYX - 2+ VIEW COMPARISON:  None. FINDINGS: There is no evidence of fracture or other focal bone lesions. IMPRESSION: Normal sacrum and coccyx. Electronically Signed   By: Lupita RaiderJames  Green Jr, M.D.   On: 01/25/2017 10:13     ASSESSMENT and PLAN  1. Coccyx pain Unclear etiology, not responding despite adequate conservative measures. Xrays normal. Evaluating for occult infection/inflammation. Consider referral to ortho if no systemic cause identified. - DG Sacrum/Coccyx; Future  2. Numbness and tingling of both lower extremities See above - DG Lumbar Spine Complete; Future - DG Sacrum/Coccyx; Future  3. Blood in stool No clear etiology on exam today. Referring to GI for further eval.  - CBC with Differential - Comprehensive metabolic panel - Sedimentation Rate - C-reactive protein - Ambulatory referral to Gastroenterology  Return in about 4 weeks (around 02/22/2017).    Myles LippsIrma M Santiago, MD [facility] [address] [place], [place] [address] Ph.  [phone] Fax [phone]

## 2017-01-25 NOTE — Patient Instructions (Signed)
     IF you received an x-ray today, you will receive an invoice from [facility]. Please contact [facility] at [phone] with questions or concerns regarding your invoice.   IF you received labwork today, you will receive an invoice from [facility]. Please contact [facility] at [phone] with questions or concerns regarding your invoice.   Our billing staff will not be able to assist you with questions regarding bills from these companies.  You will be contacted with the lab results as soon as they are available. The fastest way to get your results is to activate your My Chart account. Instructions are located on the last page of this paperwork. If you have not heard from us regarding the results in 2 weeks, please contact this office.     

## 2017-01-26 LAB — COMPREHENSIVE METABOLIC PANEL
ALT: 30 IU/L (ref 0–44)
AST: 23 IU/L (ref 0–40)
Albumin/Globulin Ratio: 1.7 (ref 1.2–2.2)
Albumin: 4.7 g/dL (ref 3.5–5.5)
Alkaline Phosphatase: 64 IU/L (ref 39–117)
BUN/Creatinine Ratio: 16 (ref 9–20)
BUN: 15 mg/dL (ref 6–20)
Bilirubin Total: 0.4 mg/dL (ref 0.0–1.2)
CO2: 23 mmol/L (ref 20–29)
Calcium: 9.5 mg/dL (ref 8.7–10.2)
Chloride: 106 mmol/L (ref 96–106)
Creatinine, Ser: 0.91 mg/dL (ref 0.76–1.27)
GFR calc Af Amer: 136 mL/min/{1.73_m2} (ref >59)
GFR calc non Af Amer: 118 mL/min/{1.73_m2} (ref >59)
Globulin, Total: 2.8 g/dL (ref 1.5–4.5)
Glucose: 82 mg/dL (ref 65–99)
Potassium: 4.6 mmol/L (ref 3.5–5.2)
Sodium: 142 mmol/L (ref 134–144)
Total Protein: 7.5 g/dL (ref 6.0–8.5)

## 2017-01-26 LAB — CBC WITH DIFFERENTIAL/PLATELET
Basophils Absolute: 0 10*3/uL (ref 0.0–0.2)
Basos: 1 %
EOS (ABSOLUTE): 0.1 10*3/uL (ref 0.0–0.4)
Eos: 3 %
Hematocrit: 45.3 % (ref 37.5–51.0)
Hemoglobin: 15.3 g/dL (ref 13.0–17.7)
Immature Grans (Abs): 0 10*3/uL (ref 0.0–0.1)
Immature Granulocytes: 0 %
Lymphocytes Absolute: 2.1 10*3/uL (ref 0.7–3.1)
Lymphs: 39 %
MCH: 30.7 pg (ref 26.6–33.0)
MCHC: 33.8 g/dL (ref 31.5–35.7)
MCV: 91 fL (ref 79–97)
Monocytes Absolute: 0.4 10*3/uL (ref 0.1–0.9)
Monocytes: 8 %
Neutrophils Absolute: 2.7 10*3/uL (ref 1.4–7.0)
Neutrophils: 49 %
Platelets: 232 10*3/uL (ref 150–379)
RBC: 4.99 x10E6/uL (ref 4.14–5.80)
RDW: 13.5 % (ref 12.3–15.4)
WBC: 5.4 10*3/uL (ref 3.4–10.8)

## 2017-01-26 LAB — SEDIMENTATION RATE: Sed Rate: 2 mm/hr (ref 0–15)

## 2017-01-26 LAB — C-REACTIVE PROTEIN: CRP: 0.6 mg/L (ref 0.0–4.9)

## 2017-01-29 ENCOUNTER — Encounter: Payer: Self-pay | Admitting: Family Medicine

## 2017-04-11 ENCOUNTER — Encounter: Payer: Self-pay | Admitting: Family Medicine

## 2019-03-06 IMAGING — DX DG LUMBAR SPINE COMPLETE 4+V
5 series · 5 of 5 positions shown · non-contrast
Comparison: None.

CLINICAL DATA: Chronic lower back pain with bilateral lower
extremity numbness.

EXAM:
LUMBAR SPINE - COMPLETE 4+ VIEW

[l-spine ap]
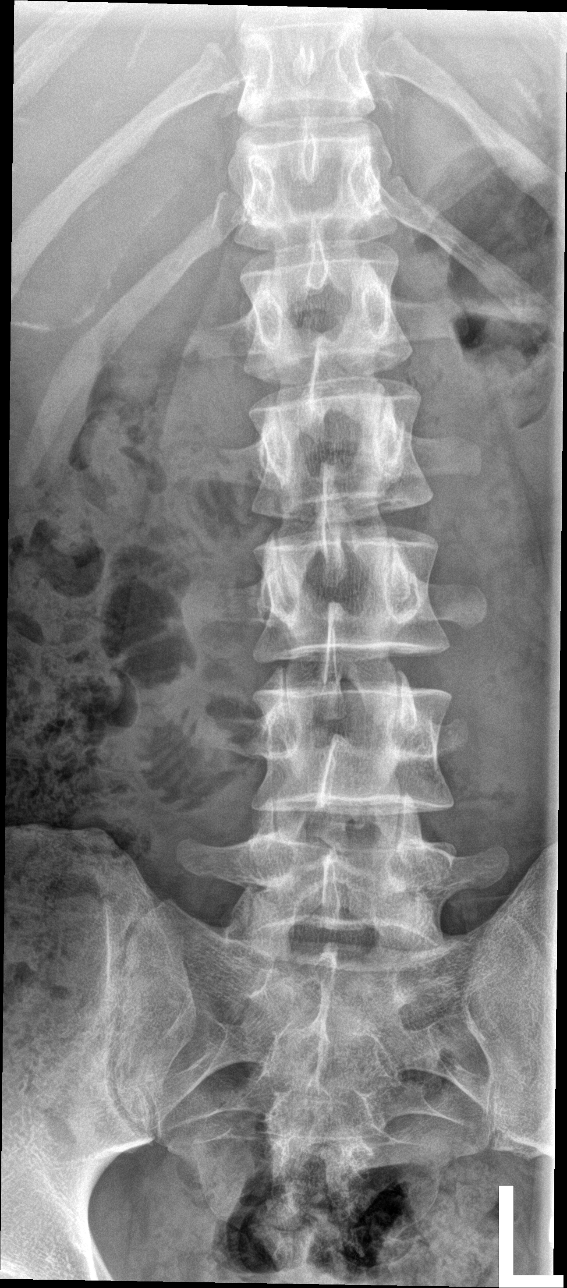

[l-spine obl (1 of 2)]
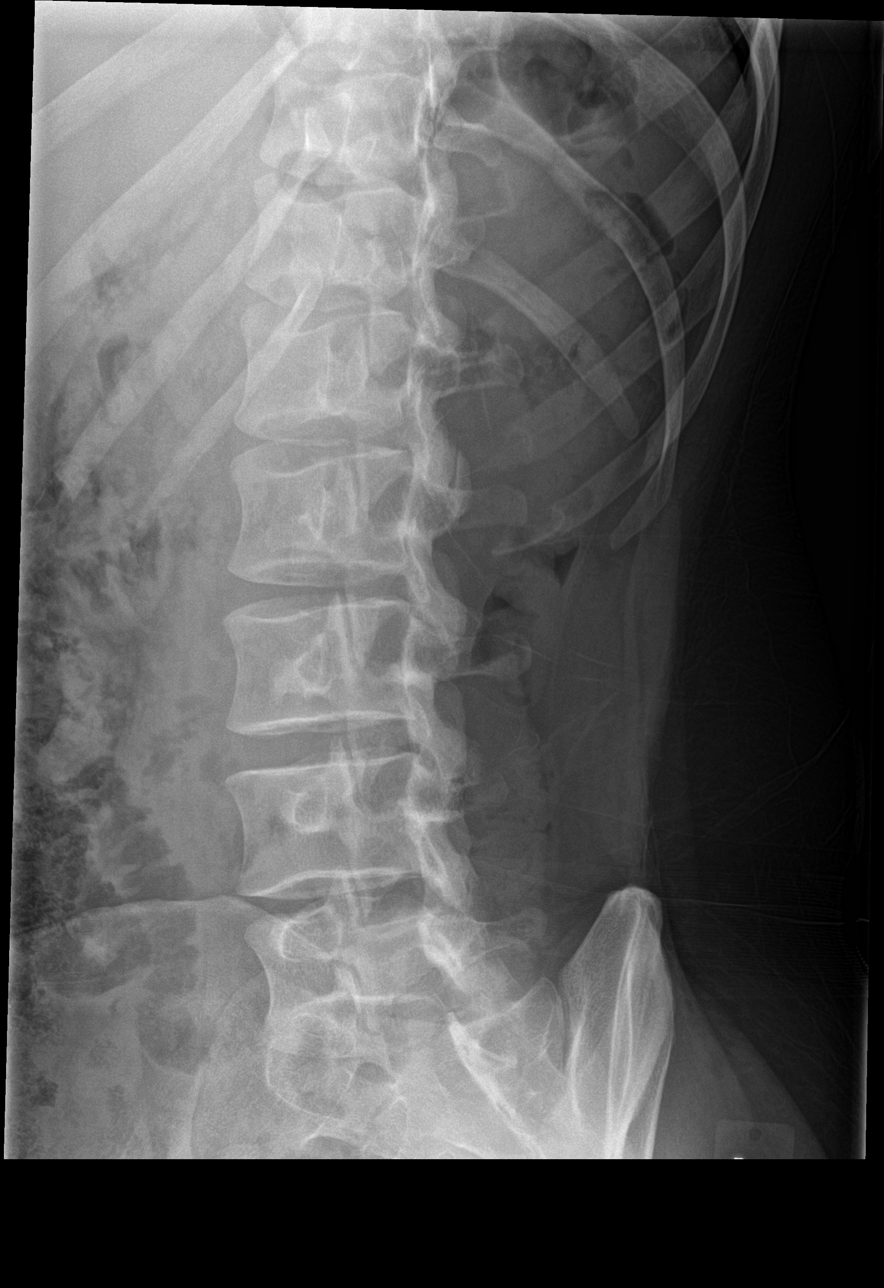

[l-spine obl (2 of 2)]
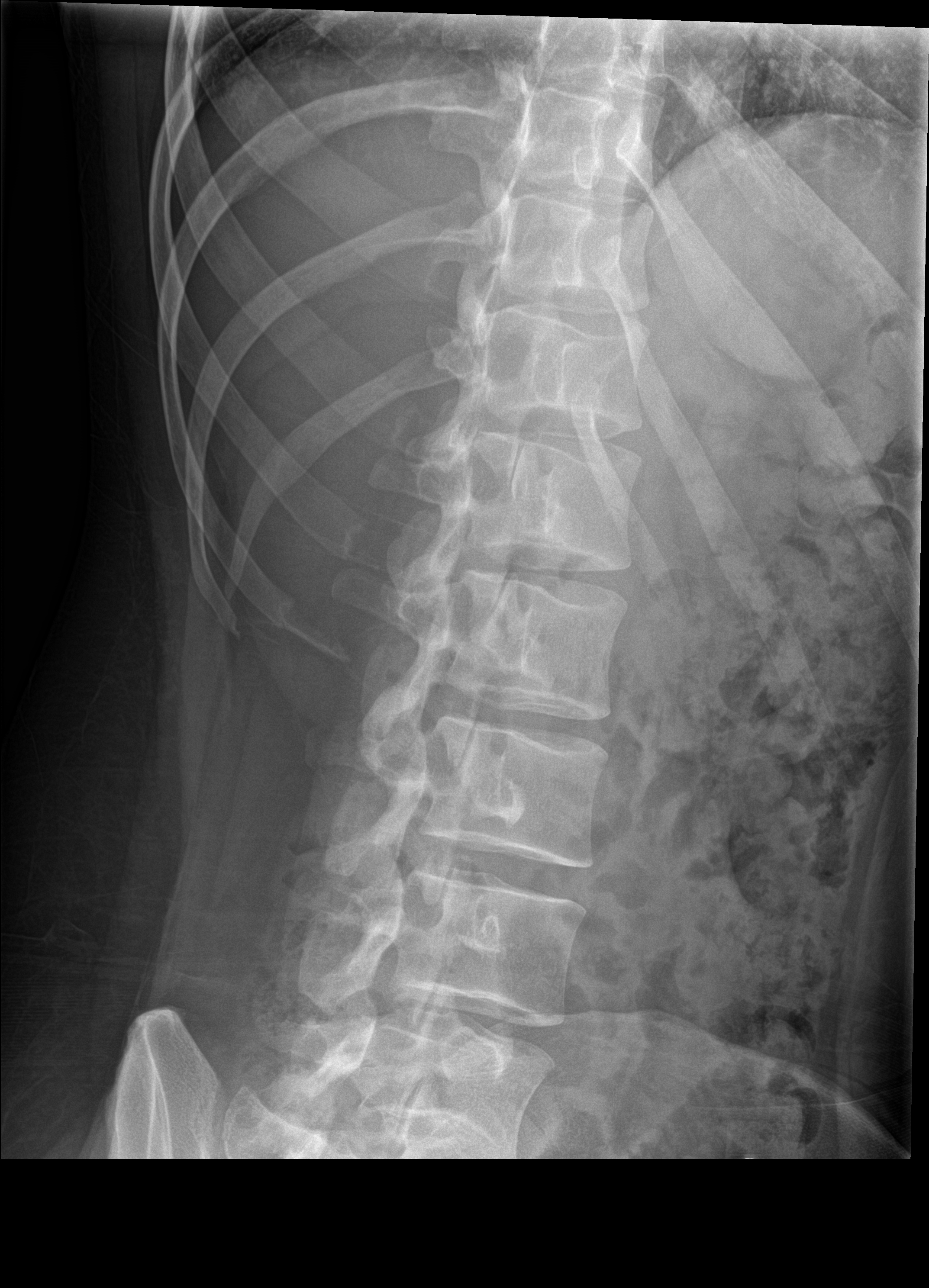

[l-spine lat]
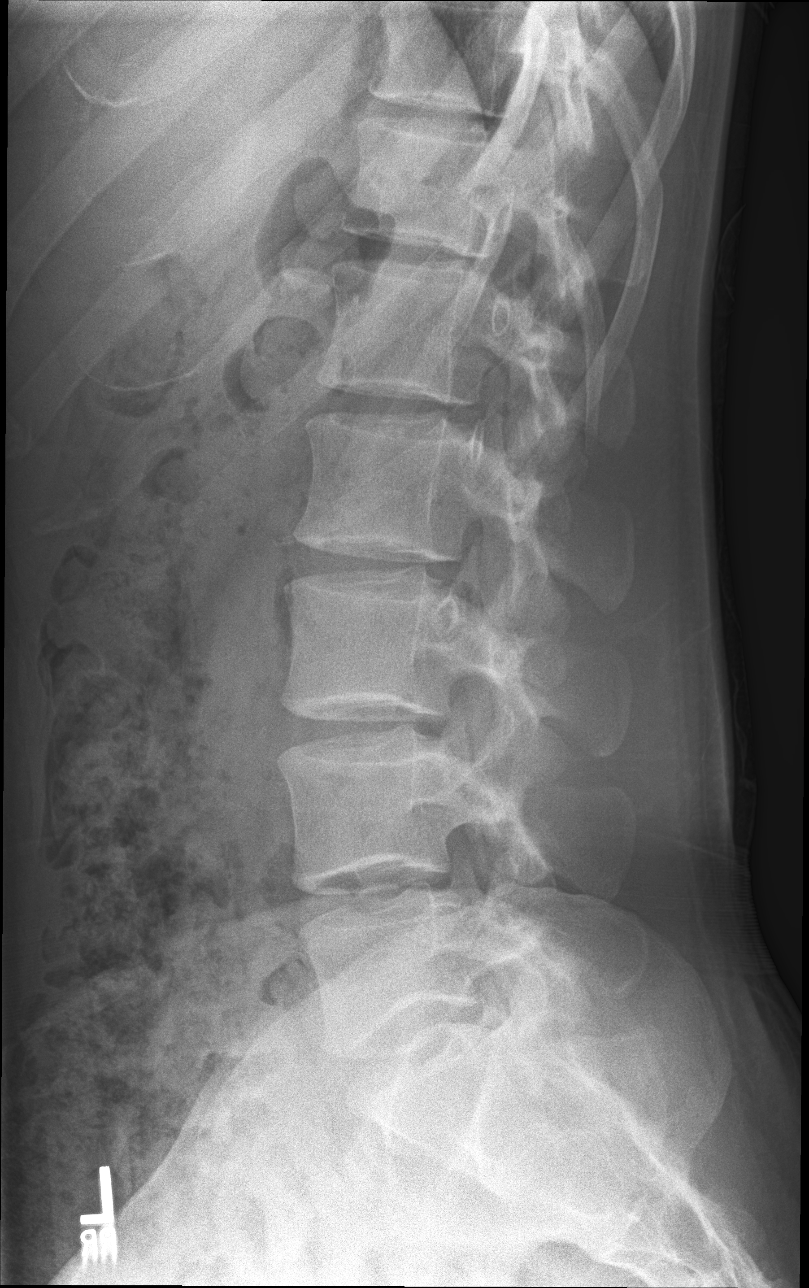

[l-spine l5-s1]
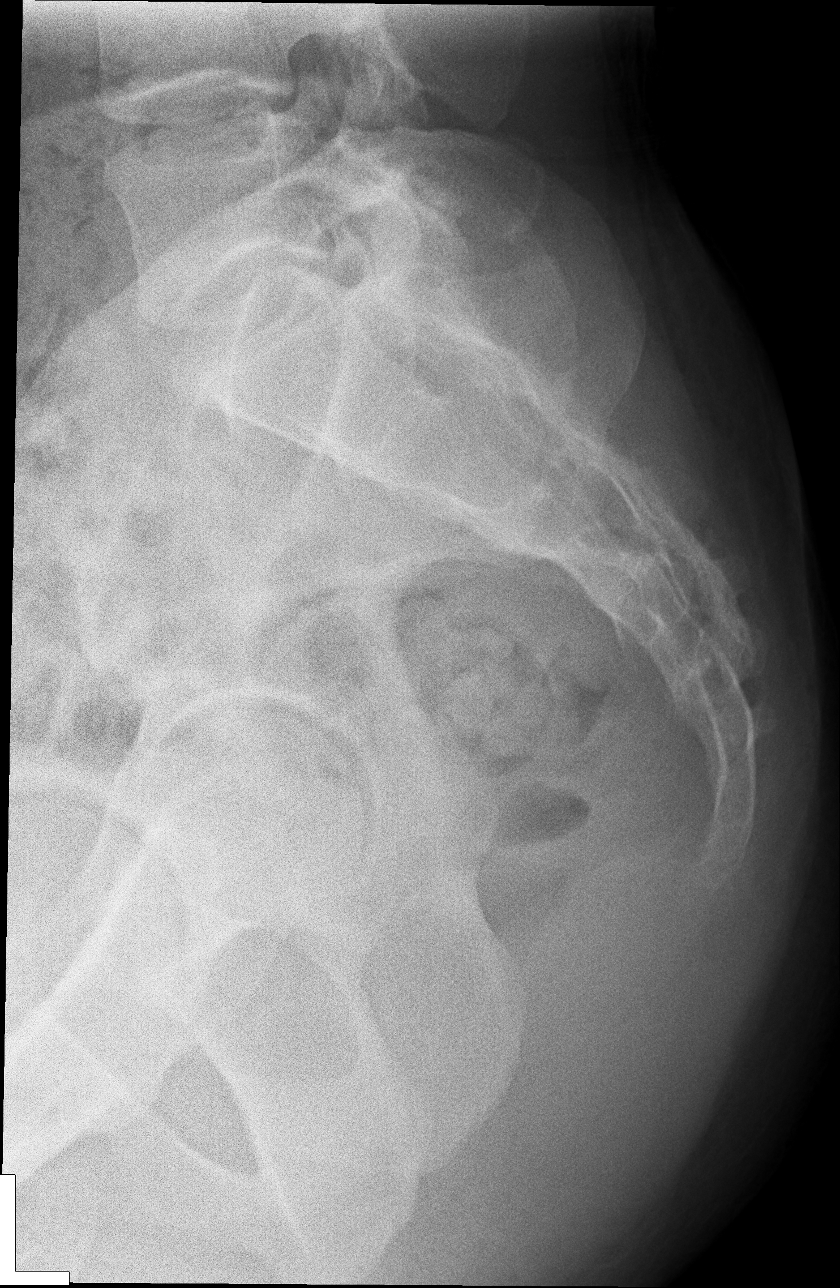

[5 of 5 positions shown; findings below may reference images not displayed]

FINDINGS: There is no evidence of lumbar spine fracture. Alignment is normal.
Intervertebral disc spaces are maintained.
IMPRESSION: Normal lumbar spine.
